# Patient Record
Sex: Male | Born: 1965 | Race: Black or African American | Hispanic: No | Smoking: Former smoker
Health system: Southern US, Community
[De-identification: ages and names within clinical notes are randomized; demographics above are authoritative.]

## PROBLEM LIST (undated history)

## (undated) DIAGNOSIS — I10 Essential (primary) hypertension: Secondary | ICD-10-CM

## (undated) DIAGNOSIS — E119 Type 2 diabetes mellitus without complications: Secondary | ICD-10-CM

## (undated) DIAGNOSIS — W3400XA Accidental discharge from unspecified firearms or gun, initial encounter: Secondary | ICD-10-CM

## (undated) HISTORY — DX: Type 2 diabetes mellitus without complications: E11.9

## (undated) HISTORY — PX: COLON SURGERY: SHX602

## (undated) HISTORY — PX: LEG SURGERY: SHX1003

## (undated) HISTORY — DX: Essential (primary) hypertension: I10

---

## 2017-08-11 ENCOUNTER — Emergency Department (HOSPITAL_COMMUNITY)
Admission: EM | Admit: 2017-08-11 | Discharge: 2017-08-11 | Disposition: A | Discharge status: Home / Self Care | Payer: Self-pay | Attending: Emergency Medicine | Admitting: Emergency Medicine

## 2017-08-11 ENCOUNTER — Emergency Department (HOSPITAL_COMMUNITY)
Admit: 2017-08-11 | Discharge: 2017-08-11 | Disposition: A | Discharge status: Home / Self Care | Payer: Self-pay | Attending: Emergency Medicine | Admitting: Emergency Medicine

## 2017-08-11 ENCOUNTER — Encounter (HOSPITAL_COMMUNITY): Payer: Self-pay | Admitting: Diagnostic Radiology

## 2017-08-11 DIAGNOSIS — Y828 Other medical devices associated with adverse incidents: Secondary | ICD-10-CM | POA: Insufficient documentation

## 2017-08-11 DIAGNOSIS — T83022A Displacement of nephrostomy catheter, initial encounter: Secondary | ICD-10-CM | POA: Insufficient documentation

## 2017-08-11 HISTORY — PX: IR NEPHROSTOMY EXCHANGE LEFT: IMG6069

## 2017-08-11 LAB — CBC WITH DIFFERENTIAL/PLATELET
BASOS ABS: 0 10*3/uL (ref 0.0–0.1)
BASOS PCT: 0 %
Eosinophils Absolute: 0.1 10*3/uL (ref 0.0–0.7)
Eosinophils Relative: 0 %
HEMATOCRIT: 41 % (ref 39.0–52.0)
HEMOGLOBIN: 13.5 g/dL (ref 13.0–17.0)
LYMPHS PCT: 16 %
Lymphs Abs: 2.2 10*3/uL (ref 0.7–4.0)
MCH: 29.6 pg (ref 26.0–34.0)
MCHC: 32.9 g/dL (ref 30.0–36.0)
MCV: 89.9 fL (ref 78.0–100.0)
MONO ABS: 0.8 10*3/uL (ref 0.1–1.0)
MONOS PCT: 6 %
NEUTROS ABS: 10.6 10*3/uL — AB (ref 1.7–7.7)
NEUTROS PCT: 78 %
Platelets: 246 10*3/uL (ref 150–400)
RBC: 4.56 MIL/uL (ref 4.22–5.81)
RDW: 14.1 % (ref 11.5–15.5)
WBC: 13.8 10*3/uL — ABNORMAL HIGH (ref 4.0–10.5)

## 2017-08-11 LAB — BASIC METABOLIC PANEL
ANION GAP: 8 (ref 5–15)
BUN: 13 mg/dL (ref 6–20)
CHLORIDE: 107 mmol/L (ref 101–111)
CO2: 27 mmol/L (ref 22–32)
Calcium: 9.3 mg/dL (ref 8.9–10.3)
Creatinine, Ser: 1.46 mg/dL — ABNORMAL HIGH (ref 0.61–1.24)
GFR calc non Af Amer: 55 mL/min — ABNORMAL LOW (ref >60)
GLUCOSE: 95 mg/dL (ref 65–99)
Potassium: 3.8 mmol/L (ref 3.5–5.1)
Sodium: 142 mmol/L (ref 135–145)

## 2017-08-11 LAB — PROTIME-INR
INR: 1.04
Prothrombin Time: 13.6 seconds (ref 11.4–15.2)

## 2017-08-11 MED ORDER — IOPAMIDOL (ISOVUE-300) INJECTION 61%
INTRAVENOUS | Status: AC
  Administered 2017-08-11: 20 mL
  Filled 2017-08-11: qty 50

## 2017-08-11 MED ORDER — MORPHINE SULFATE (PF) 2 MG/ML IV SOLN
2.0000 mg | Freq: Once | INTRAVENOUS | Status: AC
  Administered 2017-08-11: 2 mg via INTRAVENOUS
  Filled 2017-08-11: qty 1

## 2017-08-11 MED ORDER — LIDOCAINE HCL (PF) 1 % IJ SOLN
INTRAMUSCULAR | Status: AC | PRN
  Administered 2017-08-11: 5 mL

## 2017-08-11 MED ORDER — DIPHENHYDRAMINE HCL 25 MG PO CAPS
25.0000 mg | ORAL_CAPSULE | Freq: Once | ORAL | Status: AC
  Administered 2017-08-11: 25 mg via ORAL
  Filled 2017-08-11: qty 1

## 2017-08-11 MED ORDER — HYDROMORPHONE HCL 1 MG/ML IJ SOLN
1.0000 mg | Freq: Once | INTRAMUSCULAR | Status: AC
  Administered 2017-08-11: 1 mg via INTRAVENOUS
  Filled 2017-08-11: qty 1

## 2017-08-11 MED ORDER — MORPHINE SULFATE (PF) 4 MG/ML IV SOLN
8.0000 mg | Freq: Once | INTRAVENOUS | Status: AC
  Administered 2017-08-11: 8 mg via INTRAVENOUS
  Filled 2017-08-11: qty 2

## 2017-08-11 MED ORDER — DIPHENHYDRAMINE HCL 50 MG/ML IJ SOLN
25.0000 mg | Freq: Once | INTRAMUSCULAR | Status: AC
  Administered 2017-08-11: 25 mg via INTRAVENOUS
  Filled 2017-08-11: qty 1

## 2017-08-11 MED ORDER — FENTANYL CITRATE (PF) 100 MCG/2ML IJ SOLN
INTRAMUSCULAR | Status: AC | PRN
  Administered 2017-08-11: 50 ug via INTRAVENOUS

## 2017-08-11 MED ORDER — HYDROCODONE-ACETAMINOPHEN 5-325 MG PO TABS
1.0000 | ORAL_TABLET | Freq: Once | ORAL | Status: AC
  Administered 2017-08-11: 1 via ORAL
  Filled 2017-08-11: qty 1

## 2017-08-11 MED ORDER — MIDAZOLAM HCL 2 MG/2ML IJ SOLN
INTRAMUSCULAR | Status: AC | PRN
  Administered 2017-08-11: 1 mg via INTRAVENOUS

## 2017-08-11 MED ORDER — FENTANYL CITRATE (PF) 100 MCG/2ML IJ SOLN
INTRAMUSCULAR | Status: AC
  Filled 2017-08-11: qty 4

## 2017-08-11 MED ORDER — MORPHINE SULFATE (PF) 4 MG/ML IV SOLN
4.0000 mg | Freq: Once | INTRAVENOUS | Status: AC
  Administered 2017-08-11: 4 mg via INTRAVENOUS
  Filled 2017-08-11: qty 1

## 2017-08-11 MED ORDER — SODIUM CHLORIDE 0.9 % IV BOLUS (SEPSIS)
1000.0000 mL | Freq: Once | INTRAVENOUS | Status: AC
  Administered 2017-08-11: 1000 mL via INTRAVENOUS

## 2017-08-11 MED ORDER — MIDAZOLAM HCL 2 MG/2ML IJ SOLN
INTRAMUSCULAR | Status: AC
  Filled 2017-08-11: qty 4
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
## 2017-08-11 NOTE — ED Notes (Signed)
Bed: WA17 Expected date:  Expected time:  Means of arrival:  Comments: ems 

## 2017-08-11 NOTE — ED Provider Notes (Signed)
  Physical Exam  BP (!) 151/86 (BP Location: Right Arm)   Pulse 90   Temp 97.9 F (36.6 C) (Oral)   Resp 20   Ht 5\' 10"  (1.778 m)   Wt 112.5 kg (248 lb)   SpO2 98% Comment: RA Simultaneous filing. User may not have seen previous data.  BMI 35.58 kg/m   Physical Exam  Gen: VSS in NAD Pulm: CTAB CV: RRR with normal s1, s2 Abd: Colostomy at LLQ. Nephrostomy site shows completely dislodged tube. No obvious cellulitis, drainage, or signs of infection of either site.  MSK: FROM without difficulty Skin: pink, warm, dry  ED Course  Procedures  MDM Patient signed out by Ivar Drapeob Browning, PA-C. Please see previous notes for further history. Patient presents today as he externally pulled out his nephrostomy tube. He normally gets the tube change in [place], but is in [place] presently. Case has been discussed with urology, and Dr. McDiarmid requests IR to place 2 today. Discussed case with IR, and order placed for left nephrostomy tube exchange.   On reassessment, patient appears comfortable in no apparent distress. Asking for more Benadryl to help the itching, and further pain relief. Patient states he takes Percocets regularly for abdominal pain, and this feels like his typical abdominal pain.  Nephrostomy tube was replaced. Patient requesting food, and he tolerated by mouth easily. At this time, patient appears safe for discharge. Return precautions given. Patient states his ride cannot come and pick him up until 2:00 this afternoon.      Alveria ApleyCaccavale, Xanthe Couillard, PA-C 08/11/17 1556
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
## 2017-08-11 NOTE — ED Notes (Signed)
EDPA Provider at bedside. 

## 2017-08-11 NOTE — ED Notes (Signed)
PT TRANSFERRED TO IR

## 2017-08-11 NOTE — Discharge Instructions (Signed)
Taken your at-home medications as prescribed. Do not drive for 24 hours, as you were given medications that can cause you to be sleepy. Follow-up with your primary care doctor or the doctor who changes his nephrostomy tube as needed. Return to the emergency room if you develop fever, chills, worsening pain, or any new or worsening symptoms.

## 2017-08-11 NOTE — ED Triage Notes (Signed)
Patient BIB [facility] from home. Pt got up to BR and and caught nephrostomy tube on door knob and pulled it out. Pt c/o back pain at site, left flank pain and abdominal pain. Tube pulled out around 0030 per patient. Pt covered site with a dressing. EMS reports bleeding controled.

## 2017-08-11 NOTE — Procedures (Signed)
Successful replacement of left nephrostomy tube.  10.2 Fr tube is positioned in renal pelvis and draining well.  No blood loss and no immediate complication.

## 2018-02-28 ENCOUNTER — Emergency Department (HOSPITAL_COMMUNITY): Payer: Self-pay

## 2018-02-28 ENCOUNTER — Other Ambulatory Visit: Payer: Self-pay

## 2018-02-28 ENCOUNTER — Encounter (HOSPITAL_COMMUNITY): Payer: Self-pay | Admitting: *Deleted

## 2018-02-28 ENCOUNTER — Inpatient Hospital Stay (HOSPITAL_COMMUNITY)
Admission: EM | Admit: 2018-02-28 | Discharge: 2018-03-02 | DRG: 690 | Disposition: A | Discharge status: Home / Self Care | Payer: Self-pay | Attending: Internal Medicine | Admitting: Internal Medicine

## 2018-02-28 DIAGNOSIS — F1490 Cocaine use, unspecified, uncomplicated: Secondary | ICD-10-CM | POA: Diagnosis present

## 2018-02-28 DIAGNOSIS — F1290 Cannabis use, unspecified, uncomplicated: Secondary | ICD-10-CM | POA: Diagnosis present

## 2018-02-28 DIAGNOSIS — Z765 Malingerer [conscious simulation]: Secondary | ICD-10-CM

## 2018-02-28 DIAGNOSIS — N12 Tubulo-interstitial nephritis, not specified as acute or chronic: Secondary | ICD-10-CM | POA: Diagnosis present

## 2018-02-28 DIAGNOSIS — F17210 Nicotine dependence, cigarettes, uncomplicated: Secondary | ICD-10-CM | POA: Diagnosis present

## 2018-02-28 DIAGNOSIS — Z936 Other artificial openings of urinary tract status: Secondary | ICD-10-CM

## 2018-02-28 DIAGNOSIS — E876 Hypokalemia: Secondary | ICD-10-CM | POA: Diagnosis present

## 2018-02-28 DIAGNOSIS — K5730 Diverticulosis of large intestine without perforation or abscess without bleeding: Secondary | ICD-10-CM | POA: Diagnosis present

## 2018-02-28 DIAGNOSIS — Z933 Colostomy status: Secondary | ICD-10-CM

## 2018-02-28 DIAGNOSIS — N10 Acute tubulo-interstitial nephritis: Principal | ICD-10-CM

## 2018-02-28 HISTORY — DX: Accidental discharge from unspecified firearms or gun, initial encounter: W34.00XA

## 2018-02-28 LAB — CBC WITH DIFFERENTIAL/PLATELET
Basophils Absolute: 0 10*3/uL (ref 0.0–0.1)
Basophils Relative: 0 %
EOS ABS: 0 10*3/uL (ref 0.0–0.7)
Eosinophils Relative: 0 %
HEMATOCRIT: 45 % (ref 39.0–52.0)
HEMOGLOBIN: 14.8 g/dL (ref 13.0–17.0)
LYMPHS ABS: 1.7 10*3/uL (ref 0.7–4.0)
LYMPHS PCT: 11 %
MCH: 30 pg (ref 26.0–34.0)
MCHC: 32.9 g/dL (ref 30.0–36.0)
MCV: 91.1 fL (ref 78.0–100.0)
MONOS PCT: 5 %
Monocytes Absolute: 0.8 10*3/uL (ref 0.1–1.0)
NEUTROS PCT: 84 %
Neutro Abs: 12 10*3/uL — ABNORMAL HIGH (ref 1.7–7.7)
Platelets: 238 10*3/uL (ref 150–400)
RBC: 4.94 MIL/uL (ref 4.22–5.81)
RDW: 14.2 % (ref 11.5–15.5)
WBC: 14.5 10*3/uL — ABNORMAL HIGH (ref 4.0–10.5)

## 2018-02-28 MED ORDER — SODIUM CHLORIDE 0.9 % IV BOLUS (SEPSIS)
1000.0000 mL | Freq: Once | INTRAVENOUS | Status: AC
  Administered 2018-02-28: 1000 mL via INTRAVENOUS

## 2018-02-28 MED ORDER — MORPHINE SULFATE (PF) 2 MG/ML IV SOLN
INTRAVENOUS | Status: AC
  Filled 2018-02-28: qty 2

## 2018-02-28 MED ORDER — DIPHENHYDRAMINE HCL 25 MG PO CAPS
25.0000 mg | ORAL_CAPSULE | Freq: Once | ORAL | Status: AC
  Administered 2018-02-28: 25 mg via ORAL
  Filled 2018-02-28: qty 1

## 2018-02-28 MED ORDER — KETOROLAC TROMETHAMINE 30 MG/ML IJ SOLN
30.0000 mg | Freq: Once | INTRAMUSCULAR | Status: AC
  Administered 2018-02-28: 30 mg via INTRAVENOUS
  Filled 2018-02-28: qty 1

## 2018-02-28 MED ORDER — ONDANSETRON HCL 4 MG/2ML IJ SOLN
4.0000 mg | Freq: Once | INTRAMUSCULAR | Status: AC
  Administered 2018-02-28: 4 mg via INTRAVENOUS
  Filled 2018-02-28: qty 2

## 2018-02-28 MED ORDER — MORPHINE SULFATE (PF) 4 MG/ML IV SOLN
4.0000 mg | Freq: Once | INTRAVENOUS | Status: AC
  Administered 2018-02-28: 4 mg via INTRAVENOUS
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
## 2018-02-28 NOTE — ED Triage Notes (Signed)
Pt brought in by [facility] for c/o 3 days of foul smelling urine; pt has had mild fever and given one gram of tylenol; pt c/o abdominal pain and difficulty urinating; pt c/o left side flank pain

## 2018-03-01 ENCOUNTER — Other Ambulatory Visit: Payer: Self-pay

## 2018-03-01 ENCOUNTER — Emergency Department (HOSPITAL_COMMUNITY): Payer: Self-pay

## 2018-03-01 DIAGNOSIS — N10 Acute tubulo-interstitial nephritis: Principal | ICD-10-CM

## 2018-03-01 DIAGNOSIS — N12 Tubulo-interstitial nephritis, not specified as acute or chronic: Secondary | ICD-10-CM | POA: Diagnosis present

## 2018-03-01 LAB — COMPREHENSIVE METABOLIC PANEL
ALK PHOS: 75 U/L (ref 38–126)
ALT: 16 U/L — ABNORMAL LOW (ref 17–63)
ANION GAP: 10 (ref 5–15)
AST: 30 U/L (ref 15–41)
Albumin: 4.2 g/dL (ref 3.5–5.0)
BILIRUBIN TOTAL: 1.1 mg/dL (ref 0.3–1.2)
BUN: 14 mg/dL (ref 6–20)
CALCIUM: 9.3 mg/dL (ref 8.9–10.3)
CO2: 25 mmol/L (ref 22–32)
Chloride: 100 mmol/L — ABNORMAL LOW (ref 101–111)
Creatinine, Ser: 1.35 mg/dL — ABNORMAL HIGH (ref 0.61–1.24)
GFR, EST NON AFRICAN AMERICAN: 60 mL/min — AB (ref >60)
GLUCOSE: 97 mg/dL (ref 65–99)
Potassium: 3.4 mmol/L — ABNORMAL LOW (ref 3.5–5.1)
Sodium: 135 mmol/L (ref 135–145)
TOTAL PROTEIN: 7.7 g/dL (ref 6.5–8.1)

## 2018-03-01 LAB — CBC
HEMATOCRIT: 42.4 % (ref 39.0–52.0)
Hemoglobin: 13.5 g/dL (ref 13.0–17.0)
MCH: 29.3 pg (ref 26.0–34.0)
MCHC: 31.8 g/dL (ref 30.0–36.0)
MCV: 92 fL (ref 78.0–100.0)
Platelets: 224 10*3/uL (ref 150–400)
RBC: 4.61 MIL/uL (ref 4.22–5.81)
RDW: 14.6 % (ref 11.5–15.5)
WBC: 10.3 10*3/uL (ref 4.0–10.5)

## 2018-03-01 LAB — URINALYSIS, ROUTINE W REFLEX MICROSCOPIC
Bilirubin Urine: NEGATIVE
GLUCOSE, UA: NEGATIVE mg/dL
Ketones, ur: NEGATIVE mg/dL
NITRITE: NEGATIVE
Protein, ur: 100 mg/dL — AB
Specific Gravity, Urine: 1.015 (ref 1.005–1.030)
pH: 6 (ref 5.0–8.0)

## 2018-03-01 LAB — I-STAT CG4 LACTIC ACID, ED: Lactic Acid, Venous: 1.47 mmol/L (ref 0.5–1.9)

## 2018-03-01 LAB — BASIC METABOLIC PANEL
Anion gap: 9 (ref 5–15)
BUN: 13 mg/dL (ref 6–20)
CHLORIDE: 103 mmol/L (ref 101–111)
CO2: 25 mmol/L (ref 22–32)
Calcium: 8.3 mg/dL — ABNORMAL LOW (ref 8.9–10.3)
Creatinine, Ser: 1.25 mg/dL — ABNORMAL HIGH (ref 0.61–1.24)
GFR calc Af Amer: >60 mL/min (ref >60)
GFR calc non Af Amer: >60 mL/min (ref >60)
GLUCOSE: 140 mg/dL — AB (ref 65–99)
POTASSIUM: 3.7 mmol/L (ref 3.5–5.1)
Sodium: 137 mmol/L (ref 135–145)

## 2018-03-01 LAB — GLUCOSE, CAPILLARY: Glucose-Capillary: 87 mg/dL (ref 65–99)

## 2018-03-01 MED ORDER — SODIUM CHLORIDE 0.9 % IV SOLN
1.0000 g | INTRAVENOUS | Status: DC
  Administered 2018-03-01: 1 g via INTRAVENOUS
  Filled 2018-03-01 (×2): qty 10

## 2018-03-01 MED ORDER — KETOROLAC TROMETHAMINE 30 MG/ML IJ SOLN
30.0000 mg | Freq: Four times a day (QID) | INTRAMUSCULAR | Status: DC | PRN
  Administered 2018-03-01: 30 mg via INTRAVENOUS
  Filled 2018-03-01 (×2): qty 1

## 2018-03-01 MED ORDER — ENOXAPARIN SODIUM 40 MG/0.4ML ~~LOC~~ SOLN
40.0000 mg | SUBCUTANEOUS | Status: DC
  Filled 2018-03-01: qty 0.4

## 2018-03-01 MED ORDER — ONDANSETRON HCL 4 MG PO TABS: 4.0000 mg | ORAL_TABLET | Freq: Four times a day (QID) | ORAL | Status: DC | PRN

## 2018-03-01 MED ORDER — HYDROMORPHONE HCL 1 MG/ML IJ SOLN
1.0000 mg | INTRAMUSCULAR | Status: DC | PRN
  Administered 2018-03-01 – 2018-03-02 (×6): 1 mg via INTRAVENOUS
  Filled 2018-03-01 (×6): qty 1

## 2018-03-01 MED ORDER — POTASSIUM CHLORIDE 20 MEQ/15ML (10%) PO SOLN
40.0000 meq | Freq: Once | ORAL | Status: AC
  Administered 2018-03-01: 40 meq via ORAL
  Filled 2018-03-01: qty 30

## 2018-03-01 MED ORDER — DIPHENHYDRAMINE HCL 25 MG PO CAPS
25.0000 mg | ORAL_CAPSULE | Freq: Four times a day (QID) | ORAL | Status: DC | PRN
  Administered 2018-03-01 – 2018-03-02 (×4): 25 mg via ORAL
  Filled 2018-03-01 (×4): qty 1

## 2018-03-01 MED ORDER — HYDROCODONE-ACETAMINOPHEN 5-325 MG PO TABS
1.0000 | ORAL_TABLET | ORAL | Status: DC | PRN
  Administered 2018-03-01: 2 via ORAL
  Administered 2018-03-01: 1 via ORAL
  Administered 2018-03-02 (×3): 2 via ORAL
  Filled 2018-03-01 (×3): qty 2
  Filled 2018-03-01: qty 1
  Filled 2018-03-01: qty 2

## 2018-03-01 MED ORDER — SODIUM CHLORIDE 0.9 % IV SOLN
INTRAVENOUS | Status: DC
  Administered 2018-03-01: 02:00:00 via INTRAVENOUS

## 2018-03-01 MED ORDER — ACETAMINOPHEN 325 MG PO TABS: 650.0000 mg | ORAL_TABLET | Freq: Four times a day (QID) | ORAL | Status: DC | PRN

## 2018-03-01 MED ORDER — SODIUM CHLORIDE 0.9 % IV SOLN
1.0000 g | Freq: Once | INTRAVENOUS | Status: AC
  Administered 2018-03-01: 1 g via INTRAVENOUS
  Filled 2018-03-01: qty 10

## 2018-03-01 MED ORDER — SODIUM CHLORIDE 0.9 % IV SOLN
INTRAVENOUS | Status: AC
  Administered 2018-03-01 (×2): via INTRAVENOUS

## 2018-03-01 MED ORDER — IOPAMIDOL (ISOVUE-300) INJECTION 61%
100.0000 mL | Freq: Once | INTRAVENOUS | Status: AC | PRN
  Administered 2018-03-01: 100 mL via INTRAVENOUS

## 2018-03-01 MED ORDER — ONDANSETRON HCL 4 MG/2ML IJ SOLN: 4.0000 mg | Freq: Four times a day (QID) | INTRAMUSCULAR | Status: DC | PRN

## 2018-03-01 MED ORDER — ACETAMINOPHEN 650 MG RE SUPP: 650.0000 mg | Freq: Four times a day (QID) | RECTAL | Status: DC | PRN

## 2018-03-01 MED ORDER — OXYCODONE-ACETAMINOPHEN 5-325 MG PO TABS
2.0000 | ORAL_TABLET | Freq: Once | ORAL | Status: AC
  Administered 2018-03-01: 2 via ORAL
  Filled 2018-03-01: qty 2
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
## 2018-03-01 NOTE — ED Notes (Addendum)
Patient transported to X-ray & CT °
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
## 2018-03-01 NOTE — Plan of Care (Signed)
  Pain Managment: General experience of comfort will improve 03/01/2018 2215 - Progressing by Retta MacWilliams, Tag Wurtz M, RN   Progressing Education: Knowledge of General Education information will improve 03/01/2018 2215 - Progressing by Retta MacWilliams, Aeon Kessner M, RN Clinical Measurements: Will remain free from infection 03/01/2018 2215 - Progressing by Retta MacWilliams, Katoria Yetman M, RN Diagnostic test results will improve 03/01/2018 2215 - Progressing by Retta MacWilliams, Aaryn Parrilla M, RN Elimination: Will not experience complications related to bowel motility 03/01/2018 2215 - Progressing by Retta MacWilliams, Anavey Coombes M, RN Will not experience complications related to urinary retention 03/01/2018 2215 - Progressing by Retta MacWilliams, Augustino Savastano M, RN Pain Managment: General experience of comfort will improve 03/01/2018 2215 - Progressing by Retta MacWilliams, Kaegan Stigler M, RN Safety: Ability to remain free from injury will improve 03/01/2018 2215 - Progressing by Retta MacWilliams, Milliani Herrada M, RN
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
## 2018-03-01 NOTE — Progress Notes (Signed)
Pharmacy Antibiotic Note  John Carson is a 52 y.o. male admitted on 02/28/2018 with pyelonephritis.  Pharmacy has been consulted for ROCEPHIN dosing.  Plan: Rocephin 1gm IV q24hrs Monitor labs, progress, c/s  Antimicrobials this admission: Rocephin 3/5 >>   Dose adjustments this admission:  Microbiology results: 3/5 BCx: pending 3/6 UCx: pending   Height: 5\' 10"  (177.8 cm) Weight: 269 lb 6.4 oz (122.2 kg) IBW/kg (Calculated) : 73  Temp (24hrs), Avg:98.2 F (36.8 C), Min:98.1 F (36.7 C), Max:98.3 F (36.8 C)  Recent Labs  Lab 02/28/18 2347 03/01/18 0008 03/01/18 0515  WBC 14.5*  --  10.3  CREATININE 1.35*  --  1.25*  LATICACIDVEN  --  1.47  --     Estimated Creatinine Clearance: 92.7 mL/min (A) (by C-G formula based on SCr of 1.25 mg/dL (H)).    No Known Allergies  Thank you for allowing pharmacy to be a part of this patient's care.  Valrie HartHall, Aarin Sparkman A 03/01/2018 11:18 AM

## 2018-03-02 LAB — CBC
HCT: 41.4 % (ref 39.0–52.0)
Hemoglobin: 12.9 g/dL — ABNORMAL LOW (ref 13.0–17.0)
MCH: 29.4 pg (ref 26.0–34.0)
MCHC: 31.2 g/dL (ref 30.0–36.0)
MCV: 94.3 fL (ref 78.0–100.0)
PLATELETS: 213 10*3/uL (ref 150–400)
RBC: 4.39 MIL/uL (ref 4.22–5.81)
RDW: 14.8 % (ref 11.5–15.5)
WBC: 7.7 10*3/uL (ref 4.0–10.5)

## 2018-03-02 LAB — GLUCOSE, CAPILLARY: GLUCOSE-CAPILLARY: 89 mg/dL (ref 65–99)

## 2018-03-02 LAB — BASIC METABOLIC PANEL
Anion gap: 7 (ref 5–15)
BUN: 15 mg/dL (ref 6–20)
CALCIUM: 8.6 mg/dL — AB (ref 8.9–10.3)
CO2: 27 mmol/L (ref 22–32)
Chloride: 104 mmol/L (ref 101–111)
Creatinine, Ser: 1.15 mg/dL (ref 0.61–1.24)
Glucose, Bld: 102 mg/dL — ABNORMAL HIGH (ref 65–99)
Potassium: 4.8 mmol/L (ref 3.5–5.1)
Sodium: 138 mmol/L (ref 135–145)

## 2018-03-02 LAB — URINE CULTURE

## 2018-03-02 LAB — HIV ANTIBODY (ROUTINE TESTING W REFLEX): HIV Screen 4th Generation wRfx: NONREACTIVE

## 2018-03-02 MED ORDER — CIPROFLOXACIN HCL 500 MG PO TABS: 500.0000 mg | ORAL_TABLET | Freq: Two times a day (BID) | ORAL | 0 refills | Status: AC

## 2018-03-02 NOTE — Care Management Note (Signed)
Case Management Note  Patient Details  Name: John Carson MRN: [id number] Date of Birth: [birth year]  Subjective/Objective:             Admitted with pyelonephritis. Pt with multiple social issues, is ind with ADL's. Has no insurance, no income. Pt will need abx at [place]. CSW following for social issues.        Action/Plan: DC today. MATCH given to assist with cost of medications.    Expected Discharge Date:  03/02/18               Expected Discharge Plan:  Home/Self Care  In-House Referral:  NA  Discharge planning Services  CM Consult, MATCH Program  Status of Service:  Completed, signed off   Malcolm MetroChildress, John Elison Demske, RN 03/02/2018, 11:21 AM

## 2018-03-02 NOTE — Progress Notes (Signed)
Discharge instructions and RX reviewed with pt verb understanding. Pt left with all belongings in hand, extra supplies for colostomy, meal for travel to [place] by bus, cab voucher. Pt wheeled out to ED waiting area for cab ride to [facility] to pick up prescriptions and to bus station.

## 2018-03-02 NOTE — Progress Notes (Signed)
Pt requesting pain medication. dialudid 1mg  given IV with flush before and after. Pt states," [facility] are not giving me my medication. I want another IV." RN informed pt IV is patent and working properly. MD informed.

## 2018-03-02 NOTE — Clinical Social Work Note (Signed)
LCSW made arrangements in obtaining patient a bus ticket via [employer] from [place] to [place]. LCSW arranged transport to [facility] to pick up prescriptions and then to the [place] [employer].   LCSW spoke with John Carson, [phone]. Mr. John Carson stated that he would pick patient up from the bus station. He verified that patient has been a client at [facility] for two years. He stated that he would ensure that patent was picked up and get him to his medical. He verified that patient has a room with access to facilities at the address listed in the chart.     LCSW signing off.
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
## 2018-03-05 LAB — CULTURE, BLOOD (ROUTINE X 2)
CULTURE: NO GROWTH
Culture: NO GROWTH
SPECIAL REQUESTS: ADEQUATE
Special Requests: ADEQUATE
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
## 2022-07-16 ENCOUNTER — Encounter (HOSPITAL_COMMUNITY): Payer: Self-pay | Admitting: Emergency Medicine

## 2022-07-16 ENCOUNTER — Emergency Department (HOSPITAL_COMMUNITY): Payer: Self-pay

## 2022-07-16 ENCOUNTER — Emergency Department (HOSPITAL_COMMUNITY)
Admission: EM | Admit: 2022-07-16 | Discharge: 2022-07-16 | Disposition: A | Discharge status: Home / Self Care | Payer: Self-pay | Attending: Emergency Medicine | Admitting: Emergency Medicine

## 2022-07-16 DIAGNOSIS — M25561 Pain in right knee: Secondary | ICD-10-CM | POA: Insufficient documentation

## 2022-07-16 DIAGNOSIS — E119 Type 2 diabetes mellitus without complications: Secondary | ICD-10-CM | POA: Insufficient documentation

## 2022-07-16 DIAGNOSIS — I10 Essential (primary) hypertension: Secondary | ICD-10-CM | POA: Insufficient documentation

## 2022-07-16 MED ORDER — OXYCODONE-ACETAMINOPHEN 5-325 MG PO TABS
1.0000 | ORAL_TABLET | Freq: Once | ORAL | Status: AC
  Administered 2022-07-16: 1 via ORAL
  Filled 2022-07-16: qty 1

## 2022-07-16 MED ORDER — NAPROXEN 500 MG PO TABS: 500.0000 mg | ORAL_TABLET | Freq: Two times a day (BID) | ORAL | 0 refills | Status: AC

## 2022-07-16 MED ORDER — HYDROCODONE-ACETAMINOPHEN 5-325 MG PO TABS: ORAL_TABLET | ORAL | 0 refills | Status: AC
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
## 2022-07-16 NOTE — ED Notes (Signed)
Patient transported to X-ray 

## 2022-07-16 NOTE — Discharge Instructions (Signed)
Use the Ace wrap when walking or standing.  You may remove at bedtime and bathing.  Elevate and apply ice packs on and off.  Follow-up with your primary care provider or with the orthopedic provider listed in 1 to 2 weeks if not improving.

## 2022-07-16 NOTE — ED Triage Notes (Addendum)
Pain to entire RT leg, has taken over the counter medications with no relief. Denies any injury. Reports he is a [employer].
